# Patient Record
Sex: Female | Born: 1995 | Race: White | Hispanic: No | Marital: Single | Smoking: Never smoker
Health system: Southern US, Community
[De-identification: ages and names within clinical notes are randomized; demographics above are authoritative.]

## PROBLEM LIST (undated history)

## (undated) ENCOUNTER — Emergency Department (HOSPITAL_COMMUNITY): Admission: EM | Payer: Self-pay | Source: Home / Self Care

## (undated) DIAGNOSIS — F32A Depression, unspecified: Secondary | ICD-10-CM

## (undated) DIAGNOSIS — M26609 Unspecified temporomandibular joint disorder, unspecified side: Secondary | ICD-10-CM

## (undated) DIAGNOSIS — F419 Anxiety disorder, unspecified: Secondary | ICD-10-CM

## (undated) DIAGNOSIS — F909 Attention-deficit hyperactivity disorder, unspecified type: Secondary | ICD-10-CM

## (undated) HISTORY — DX: Anxiety disorder, unspecified: F41.9

## (undated) HISTORY — DX: Depression, unspecified: F32.A

## (undated) HISTORY — PX: WISDOM TOOTH EXTRACTION: SHX21

## (undated) HISTORY — DX: Unspecified temporomandibular joint disorder, unspecified side: M26.609

## (undated) HISTORY — DX: Attention-deficit hyperactivity disorder, unspecified type: F90.9

---
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
## 2013-06-16 ENCOUNTER — Ambulatory Visit (INDEPENDENT_AMBULATORY_CARE_PROVIDER_SITE_OTHER): Payer: BC Managed Care – PPO | Admitting: Women's Health

## 2013-06-16 ENCOUNTER — Encounter: Payer: Self-pay | Admitting: Women's Health

## 2013-06-16 VITALS — BP 122/80 | Ht 63.0 in | Wt 131.5 lb

## 2013-06-16 DIAGNOSIS — Z3200 Encounter for pregnancy test, result unknown: Secondary | ICD-10-CM

## 2013-06-16 DIAGNOSIS — Z309 Encounter for contraceptive management, unspecified: Secondary | ICD-10-CM

## 2013-06-16 DIAGNOSIS — Z3049 Encounter for surveillance of other contraceptives: Secondary | ICD-10-CM

## 2013-06-16 DIAGNOSIS — Z3202 Encounter for pregnancy test, result negative: Secondary | ICD-10-CM

## 2013-06-16 MED ORDER — NORETHINDRONE ACET-ETHINYL EST 1-20 MG-MCG PO TABS: 1.0000 | ORAL_TABLET | Freq: Every day | ORAL | Status: DC

## 2013-06-16 NOTE — Progress Notes (Signed)
Christina Macias is a 17 y.o. G0 Caucasian female who is here to initiate contraception. Going to college in few months. LMP 06/02/13.  Does not smoke, no h/o DVT/PE/blood clots, does have menstrual migraines w/o aura Reviewed contraception options- desires generic COC  O: BP 122/80  Ht 5\' 3"  (1.6 m)  Wt 131 lb 8 oz (59.648 kg)  BMI 23.3 kg/m2  LMP 06/02/2013   A:  Contraception initiation   P: Rx Junel w/ 12RF     Discussed need for condoms for STD prevention     F/U 1 month  Marge Duncans, [place] 06/16/2013 12:13 PM

## 2013-06-16 NOTE — Patient Instructions (Signed)
Oral Contraception Use  Oral contraceptives (OCs) are medicines taken to prevent pregnancy. OCs work by preventing the ovaries from releasing eggs. The hormones in OCs also cause the cervical mucus to thicken, preventing the sperm from entering the uterus. The hormones also cause the uterine lining to become thin, not allowing a fertilized egg to attach to the inside of the uterus. OCs are highly effective when taken exactly as prescribed. However, OCs do not prevent sexually transmitted diseases (STDs). Safe sex practices, such as using condoms along with an OC, can help prevent STDs.   Before taking OCs, you may have a physical exam and Pap test. Your caregiver may also order blood tests if necessary. Your caregiver will make sure you are a good candidate for oral contraception. Discuss with your caregiver the possible side effects of the OC you may be prescribed. When starting an OC, it can take 2 to 3 months for the body to adjust to the changes in hormone levels in your body.   HOW TO TAKE ORAL CONTRACEPTIVES  Your caregiver may advise you on how to start taking the first cycle of OCs. Otherwise, you can:  · Start on day 1 of your menstrual period. You will not need any backup contraceptive protection with this start time.  · Start on the first Sunday after your menstrual period or the day you get your prescription. In these cases, you will need to use backup contraceptive protection for the first 7-day cycle.  After you have started taking OCs:  · If you forget to take 1 pill, take it as soon as you remember. Take the next pill at the regular time.  · If you miss 2 or more pills, use backup birth control until your next menstrual period starts.  · If you use a 28-day pack that contains inactive pills and you miss 1 of the last 7 pills (pills with no hormones), it will not matter. Throw away the rest of the non-hormone pills and start a new pill pack.  No matter which day you start the OC, you will always start  a new pack on that same day of the week. Have an extra pack of OCs and a backup contraceptive method available in case you miss some pills or lose your OC pack.  HOME CARE INSTRUCTIONS   · Do not smoke.  · Always use a condom to protect against STDs. OCs do not protect against STDs.  · Use a calendar to mark your menstrual period days.  · Read the information and directions that come with your OC. Talk to your caregiver if you have questions.  SEEK MEDICAL CARE IF:   · You develop nausea and vomiting.  · You have abnormal vaginal discharge or bleeding.  · You develop a rash.  · You miss your menstrual period.  · You are losing your hair.  · You need treatment for mood swings or depression.  · You get dizzy when taking the OC.  · You develop acne from taking the OC.  · You become pregnant.  SEEK IMMEDIATE MEDICAL CARE IF:   · You develop chest pain.  · You develop shortness of breath.  · You have an uncontrolled or severe headache.  · You develop numbness or slurred speech.  · You develop visual problems.  · You develop pain, redness, and swelling in the legs.  Document Released: 11/30/2011 Document Revised: 03/04/2012 Document Reviewed: 11/30/2011  ExitCare® Patient Information ©2014 [employer].

## 2013-07-14 ENCOUNTER — Encounter: Payer: Self-pay | Admitting: Women's Health

## 2013-07-14 ENCOUNTER — Ambulatory Visit (INDEPENDENT_AMBULATORY_CARE_PROVIDER_SITE_OTHER): Payer: BC Managed Care – PPO | Admitting: Women's Health

## 2013-07-14 VITALS — BP 110/50 | Ht 63.0 in | Wt 130.0 lb

## 2013-07-14 DIAGNOSIS — Z309 Encounter for contraceptive management, unspecified: Secondary | ICD-10-CM

## 2013-07-14 DIAGNOSIS — Z3049 Encounter for surveillance of other contraceptives: Secondary | ICD-10-CM

## 2013-07-14 MED ORDER — NORETHINDRONE ACET-ETHINYL EST 1-20 MG-MCG PO TABS: 1.0000 | ORAL_TABLET | Freq: Every day | ORAL | Status: DC

## 2013-07-14 NOTE — Progress Notes (Signed)
Patient ID: Christina Macias, female   DOB: 02-14-1996, 17 y.o.   MRN: [id number] Christina Macias is a 17 y.o.  who is here today for f/u visit after being placed on oral contraception one month ago.  She is taking Junel daily w/o any problems.  Her pack only has 21 pills and she would like a pack that has a placebo week so that she doesn't get out of the habit of taking on that week.  She leaves for college in a few weeks!  O: BP 110/50  Ht 5\' 3"  (1.6 m)  Wt 130 lb (58.968 kg)  BMI 23.03 kg/m2  LMP 07/07/2013   A: Contraception management  P: Rx changed to Junel 1/20 28tab pack, w/ 11 RF     Return 1 year for contra management visit or sooner prn problems     Condoms for STI prevention  Marge Duncans, CNM 07/14/2013 12:11 PM

## 2013-07-14 NOTE — Patient Instructions (Signed)
Oral Contraception Use  Oral contraceptives (OCs) are medicines taken to prevent pregnancy. OCs work by preventing the ovaries from releasing eggs. The hormones in OCs also cause the cervical mucus to thicken, preventing the sperm from entering the uterus. The hormones also cause the uterine lining to become thin, not allowing a fertilized egg to attach to the inside of the uterus. OCs are highly effective when taken exactly as prescribed. However, OCs do not prevent sexually transmitted diseases (STDs). Safe sex practices, such as using condoms along with an OC, can help prevent STDs.   Before taking OCs, you may have a physical exam and Pap test. Your caregiver may also order blood tests if necessary. Your caregiver will make sure you are a good candidate for oral contraception. Discuss with your caregiver the possible side effects of the OC you may be prescribed. When starting an OC, it can take 2 to 3 months for the body to adjust to the changes in hormone levels in your body.   HOW TO TAKE ORAL CONTRACEPTIVES  Your caregiver may advise you on how to start taking the first cycle of OCs. Otherwise, you can:  · Start on day 1 of your menstrual period. You will not need any backup contraceptive protection with this start time.  · Start on the first Sunday after your menstrual period or the day you get your prescription. In these cases, you will need to use backup contraceptive protection for the first 7-day cycle.  After you have started taking OCs:  · If you forget to take 1 pill, take it as soon as you remember. Take the next pill at the regular time.  · If you miss 2 or more pills, use backup birth control until your next menstrual period starts.  · If you use a 28-day pack that contains inactive pills and you miss 1 of the last 7 pills (pills with no hormones), it will not matter. Throw away the rest of the non-hormone pills and start a new pill pack.  No matter which day you start the OC, you will always start  a new pack on that same day of the week. Have an extra pack of OCs and a backup contraceptive method available in case you miss some pills or lose your OC pack.  HOME CARE INSTRUCTIONS   · Do not smoke.  · Always use a condom to protect against STDs. OCs do not protect against STDs.  · Use a calendar to mark your menstrual period days.  · Read the information and directions that come with your OC. Talk to your caregiver if you have questions.  SEEK MEDICAL CARE IF:   · You develop nausea and vomiting.  · You have abnormal vaginal discharge or bleeding.  · You develop a rash.  · You miss your menstrual period.  · You are losing your hair.  · You need treatment for mood swings or depression.  · You get dizzy when taking the OC.  · You develop acne from taking the OC.  · You become pregnant.  SEEK IMMEDIATE MEDICAL CARE IF:   · You develop chest pain.  · You develop shortness of breath.  · You have an uncontrolled or severe headache.  · You develop numbness or slurred speech.  · You develop visual problems.  · You develop pain, redness, and swelling in the legs.  Document Released: 11/30/2011 Document Revised: 03/04/2012 Document Reviewed: 11/30/2011  ExitCare® Patient Information ©2014 [employer].

## 2014-06-22 ENCOUNTER — Telehealth: Payer: Self-pay | Admitting: Obstetrics & Gynecology

## 2014-06-22 MED ORDER — NORETHINDRONE ACET-ETHINYL EST 1-20 MG-MCG PO TABS: 1.0000 | ORAL_TABLET | Freq: Every day | ORAL | Status: DC

## 2014-06-22 NOTE — Telephone Encounter (Signed)
Pt requesting refill on Microgestin.

## 2014-06-22 NOTE — Addendum Note (Signed)
Addended by: Cheral MarkerBOOKER, KIMBERLY R on: 06/22/2014 05:13 PM   Modules accepted: Orders

## 2014-07-14 ENCOUNTER — Encounter: Payer: Self-pay | Admitting: Women's Health

## 2014-07-14 ENCOUNTER — Ambulatory Visit (INDEPENDENT_AMBULATORY_CARE_PROVIDER_SITE_OTHER): Payer: BC Managed Care – PPO | Admitting: Women's Health

## 2014-07-14 VITALS — BP 116/70 | Ht 63.0 in | Wt 138.0 lb

## 2014-07-14 DIAGNOSIS — N898 Other specified noninflammatory disorders of vagina: Secondary | ICD-10-CM

## 2014-07-14 DIAGNOSIS — Z3041 Encounter for surveillance of contraceptive pills: Secondary | ICD-10-CM

## 2014-07-14 DIAGNOSIS — N9489 Other specified conditions associated with female genital organs and menstrual cycle: Secondary | ICD-10-CM

## 2014-07-14 LAB — POCT WET PREP (WET MOUNT): Clue Cells Wet Prep Whiff POC: NEGATIVE

## 2014-07-14 MED ORDER — NORETHINDRONE ACET-ETHINYL EST 1-20 MG-MCG PO TABS: 1.0000 | ORAL_TABLET | Freq: Every day | ORAL | Status: DC

## 2014-07-14 NOTE — Progress Notes (Signed)
Patient ID: Clarice Polelison G Gaxiola, female   DOB: 07/12/1996, 18 y.o.   MRN: [id number]   [facility]  Patient name: Clarice Polelison G Lown MRN [id number]  Date of birth: 05/26/1996  CC & HPI:  Clarice Polelison G Wetherington is a 18 y.o. Caucasian female presenting today for 696yr check-up after being on coc's. Doing well on Junel 1/20. Some vaginal odor x about 1wk, did have wisdom teeth pulled and has been on amoxicillin. Denies itching/irritation, abnormal d/c. Not sexually active.  Pertinent History Reviewed:  Medical & Surgical Hx:   History reviewed. No pertinent past medical history. Past Surgical History  Procedure Laterality Date  . Wisdom tooth extraction     Medications: Reviewed & Updated - see associated section Social History: Reviewed -  reports that she has never smoked. She does not have any smokeless tobacco history on file.  Objective Findings:  Vitals: BP 116/70  Ht 5\' 3"  (1.6 m)  Wt 138 lb (62.596 kg)  BMI 24.45 kg/m2  LMP 06/14/2014  Physical Examination: General appearance - alert, well appearing, and in no distress Pelvic - normal external genitalia, vulva, vagina, cervix, uterus and adnexa Creamy white nonodorous d/c  Results for orders placed in visit on 07/14/14 (from the past 24 hour(s))  POCT WET PREP (WET MOUNT)   Collection Time    07/14/14  9:23 AM      Result Value Ref Range   Source Wet Prep POC vaginal     WBC, Wet Prep HPF POC none     Bacteria Wet Prep HPF POC none     BACTERIA WET PREP MORPHOLOGY POC       Clue Cells Wet Prep HPF POC None     CLUE CELLS WET PREP WHIFF POC Negative Whiff     Yeast Wet Prep HPF POC None     KOH Wet Prep POC       Trichomonas Wet Prep HPF POC none       Assessment & Plan:  A:   Contraception management  Vaginal odor is more likely urine odor from antbx use P:  Refilled Junel 1/20 x 26yr   F/U 736yr for f/u, pap @ 19yo  Marge DuncansBooker, Anabel Lykins Randall CNM, [facility] 07/14/2014 9:23 AM

## 2014-07-14 NOTE — Patient Instructions (Signed)
Oral Contraception Use Oral contraceptive pills (OCPs) are medicines taken to prevent pregnancy. OCPs work by preventing the ovaries from releasing eggs. The hormones in OCPs also cause the cervical mucus to thicken, preventing the sperm from entering the uterus. The hormones also cause the uterine lining to become thin, not allowing a fertilized egg to attach to the inside of the uterus. OCPs are highly effective when taken exactly as prescribed. However, OCPs do not prevent sexually transmitted diseases (STDs). Safe sex practices, such as using condoms along with an OCP, can help prevent STDs. Before taking OCPs, you may have a physical exam and Pap test. Your health care provider may also order blood tests if necessary. Your health care provider will make sure you are a good candidate for oral contraception. Discuss with your health care provider the possible side effects of the OCP you may be prescribed. When starting an OCP, it can take 2 to 3 months for the body to adjust to the changes in hormone levels in your body.  HOW TO TAKE ORAL CONTRACEPTIVE PILLS Your health care provider may advise you on how to start taking the first cycle of OCPs. Otherwise, you can:   Start on day 1 of your menstrual period. You will not need any backup contraceptive protection with this start time.   Start on the first Sunday after your menstrual period or the day you get your prescription. In these cases, you will need to use backup contraceptive protection for the first week.   Start the pill at any time of your cycle. If you take the pill within 5 days of the start of your period, you are protected against pregnancy right away. In this case, you will not need a backup form of birth control. If you start at any other time of your menstrual cycle, you will need to use another form of birth control for 7 days. If your OCP is the type called a minipill, it will protect you from pregnancy after taking it for 2 days (48  hours). After you have started taking OCPs:   If you forget to take 1 pill, take it as soon as you remember. Take the next pill at the regular time.   If you miss 2 or more pills, call your health care provider because different pills have different instructions for missed doses. Use backup birth control until your next menstrual period starts.   If you use a 28-day pack that contains inactive pills and you miss 1 of the last 7 pills (pills with no hormones), it will not matter. Throw away the rest of the nonhormone pills and start a new pill pack.  No matter which day you start the OCP, you will always start a new pack on that same day of the week. Have an extra pack of OCPs and a backup contraceptive method available in case you miss some pills or lose your OCP pack.  HOME CARE INSTRUCTIONS   Do not smoke.   Always use a condom to protect against STDs. OCPs do not protect against STDs.   Use a calendar to mark your menstrual period days.   Read the information and directions that came with your OCP. Talk to your health care provider if you have questions.  SEEK MEDICAL CARE IF:   You develop nausea and vomiting.   You have abnormal vaginal discharge or bleeding.   You develop a rash.   You miss your menstrual period.   You are losing   your hair.   You need treatment for mood swings or depression.   You get dizzy when taking the OCP.   You develop acne from taking the OCP.   You become pregnant.  SEEK IMMEDIATE MEDICAL CARE IF:   You develop chest pain.   You develop shortness of breath.   You have an uncontrolled or severe headache.   You develop numbness or slurred speech.   You develop visual problems.   You develop pain, redness, and swelling in the legs.  Document Released: 11/30/2011 Document Revised: 08/13/2013 Document Reviewed: 06/01/2013 ExitCare Patient Information 2015 [employer]. This information is not intended to replace  advice given to you by your health care provider. Make sure you discuss any questions you have with your health care provider.  

## 2015-06-19 ENCOUNTER — Other Ambulatory Visit: Payer: Self-pay | Admitting: Women's Health

## 2015-07-05 ENCOUNTER — Encounter: Payer: Self-pay | Admitting: Women's Health

## 2015-07-05 ENCOUNTER — Ambulatory Visit (INDEPENDENT_AMBULATORY_CARE_PROVIDER_SITE_OTHER): Payer: BLUE CROSS/BLUE SHIELD | Admitting: Women's Health

## 2015-07-05 VITALS — BP 120/70 | HR 92 | Ht 63.0 in | Wt 148.0 lb

## 2015-07-05 DIAGNOSIS — Z3041 Encounter for surveillance of contraceptive pills: Secondary | ICD-10-CM | POA: Diagnosis not present

## 2015-07-05 MED ORDER — NORETHIN ACE-ETH ESTRAD-FE 1-20 MG-MCG PO TABS: 1.0000 | ORAL_TABLET | Freq: Every day | ORAL | Status: DC

## 2015-07-05 NOTE — Progress Notes (Signed)
Patient ID: Christina Macias, female   DOB: 01/30/1996, 19 y.o.   MRN: [id number]   [facility]  Patient name: Christina Polelison G Michl MRN [id number]  Date of birth: 10/02/1996  CC & HPI:  Christina Polelison G Thain is a 19 y.o. Caucasian female presenting today for 221yr COC check-up. Doing well on microgestin 1/20, no problems. Turns 19yo in Dec, will need pap. Going to school in [place] for [place] education, will be home for Christmas, will try to schedule then.   Pertinent History Reviewed:  Medical & Surgical Hx:   History reviewed. No pertinent past medical history. Past Surgical History  Procedure Laterality Date  . Wisdom tooth extraction     Medications: Reviewed & Updated - see associated section Social History: Reviewed -  reports that she has never smoked. She does not have any smokeless tobacco history on file.  Objective Findings:  Vitals: BP 120/70 mmHg  Pulse 92  Ht 5\' 3"  (1.6 m)  Wt 148 lb (67.132 kg)  BMI 26.22 kg/m2  LMP 06/20/2015  Physical Examination: General appearance - alert, well appearing, and in no distress  No results found for this or any previous visit (from the past 24 hour(s)).   Assessment & Plan:  A:   Contraception management P:  Rx Microgestin 1/20 w/ 11RF  Condoms for STI prevention   F/U in Dec for pap & physical   Marge DuncansBooker, Gwin Eagon Randall CNM, [facility] 07/05/2015 9:49 AM

## 2015-07-05 NOTE — Patient Instructions (Signed)
Ethinyl Estradiol; Norethindrone Acetate tablets (contraception) What is this medicine? ETHINYL ESTRADIOL; NORETHINDRONE ACETATE (ETH in il es tra DYE ole; nor eth IN drone AS e tate) is an oral contraceptive. The products combine two types of female hormones, an estrogen and a progestin. They are used to prevent ovulation and pregnancy. This medicine may be used for other purposes; ask your health care provider or pharmacist if you have questions. COMMON BRAND NAME(S): Gildess, Junel 1.5/30, Junel 1/20, LARIN, Loestrin 1.5/30, Loestrin 1/20, Microgestin 1.5/30, Microgestin 1/20 What should I tell my health care provider before I take this medicine? They need to know if you have or ever had any of these conditions: -abnormal vaginal bleeding -blood vessel disease or blood clots -breast, cervical, endometrial, ovarian, liver, or uterine cancer -diabetes -gallbladder disease -heart disease or recent heart attack -high blood pressure -high cholesterol -kidney disease -liver disease -migraine headaches -stroke -systemic lupus erythematosus (SLE) -tobacco smoker -an unusual or allergic reaction to estrogens, progestins, other medicines, foods, dyes, or preservatives -pregnant or trying to get pregnant -breast-feeding How should I use this medicine? Take this medicine by mouth. To reduce nausea, this medicine may be taken with food. Follow the directions on the prescription label. Take this medicine at the same time each day and in the order directed on the package. Do not take your medicine more often than directed. Contact your pediatrician regarding the use of this medicine in children. Special care may be needed. This medicine has been used in female children who have started having menstrual periods. A patient package insert for the product will be given with each prescription and refill. Read this sheet carefully each time. The sheet may change frequently. Overdosage: If you think you  have taken too much of this medicine contact a poison control center or emergency room at once. NOTE: This medicine is only for you. Do not share this medicine with others. What if I miss a dose? If you miss a dose, refer to the patient information sheet you received with your medicine for direction. If you miss more than one pill, this medicine may not be as effective and you may need to use another form of birth control. What may interact with this medicine? -acetaminophen -antibiotics or medicines for infections, especially rifampin, rifabutin, rifapentine, and griseofulvin, and possibly penicillins or tetracyclines -aprepitant -ascorbic acid (vitamin C) -atorvastatin -barbiturate medicines, such as phenobarbital -bosentan -carbamazepine -caffeine -clofibrate -cyclosporine -dantrolene -doxercalciferol -felbamate -grapefruit juice -hydrocortisone -medicines for anxiety or sleeping problems, such as diazepam or temazepam -medicines for diabetes, including pioglitazone -mineral oil -modafinil -mycophenolate -nefazodone -oxcarbazepine -phenytoin -prednisolone -ritonavir or other medicines for HIV infection or AIDS -rosuvastatin -selegiline -soy isoflavones supplements -St. John's wort -tamoxifen or raloxifene -theophylline -thyroid hormones -topiramate -warfarin This list may not describe all possible interactions. Give your health care provider a list of all the medicines, herbs, non-prescription drugs, or dietary supplements you use. Also tell them if you smoke, drink alcohol, or use illegal drugs. Some items may interact with your medicine. What should I watch for while using this medicine? Visit your doctor or health care professional for regular checks on your progress. You will need a regular breast and pelvic exam and Pap smear while on this medicine. Use an additional method of contraception during the first cycle that you take these tablets. If you have any reason  to think you are pregnant, stop taking this medicine right away and contact your doctor or health care professional. If you are taking this   medicine for hormone related problems, it may take several cycles of use to see improvement in your condition. Smoking increases the risk of getting a blood clot or having a stroke while you are taking birth control pills, especially if you are more than 21 years old. You are strongly advised not to smoke. This medicine can make your body retain fluid, making your fingers, hands, or ankles swell. Your blood pressure can go up. Contact your doctor or health care professional if you feel you are retaining fluid. This medicine can make you more sensitive to the sun. Keep out of the sun. If you cannot avoid being in the sun, wear protective clothing and use sunscreen. Do not use sun lamps or tanning beds/booths. If you wear contact lenses and notice visual changes, or if the lenses begin to feel uncomfortable, consult your eye care specialist. In some women, tenderness, swelling, or minor bleeding of the gums may occur. Notify your dentist if this happens. Brushing and flossing your teeth regularly may help limit this. See your dentist regularly and inform your dentist of the medicines you are taking. If you are going to have elective surgery, you may need to stop taking this medicine before the surgery. Consult your health care professional for advice. This medicine does not protect you against HIV infection (AIDS) or any other sexually transmitted diseases. What side effects may I notice from receiving this medicine? Side effects that you should report to your doctor or health care professional as soon as possible: -breast tissue changes or discharge -changes in vaginal bleeding during your period or between your periods -chest pain -coughing up blood -dizziness or fainting spells -headaches or migraines -leg, arm or groin pain -severe or sudden  headaches -stomach pain (severe) -sudden shortness of breath -sudden loss of coordination, especially on one side of the body -speech problems -symptoms of vaginal infection like itching, irritation or unusual discharge -tenderness in the upper abdomen -vomiting -weakness or numbness in the arms or legs, especially on one side of the body -yellowing of the eyes or skin Side effects that usually do not require medical attention (report to your doctor or health care professional if they continue or are bothersome): -breakthrough bleeding and spotting that continues beyond the 3 initial cycles of pills -breast tenderness -mood changes, anxiety, depression, frustration, anger, or emotional outbursts -increased sensitivity to sun or ultraviolet light -nausea -skin rash, acne, or brown spots on the skin -weight gain (slight) This list may not describe all possible side effects. Call your doctor for medical advice about side effects. You may report side effects to FDA at 1-800-FDA-1088. Where should I keep my medicine? Keep out of the reach of children. Store at room temperature between 15 and 30 degrees C (59 and 86 degrees F). Throw away any unused medicine after the expiration date. NOTE: This sheet is a summary. It may not cover all possible information. If you have questions about this medicine, talk to your doctor, pharmacist, or health care provider.  2015, Elsevier/Gold Standard. (2013-04-18 15:35:20)  

## 2016-03-13 ENCOUNTER — Telehealth: Payer: Self-pay | Admitting: *Deleted

## 2016-03-13 MED ORDER — NORETHIN ACE-ETH ESTRAD-FE 1-20 MG-MCG PO TABS: 1.0000 | ORAL_TABLET | Freq: Every day | ORAL | Status: DC

## 2016-03-13 NOTE — Telephone Encounter (Signed)
Pt states insurance is requiring her to [facility] for [facility] day supply.    Pt requesting refill for Microgestin FE 90 day supply, [facility] phone # [phone].

## 2016-03-13 NOTE — Telephone Encounter (Signed)
Ok to refill per pt request

## 2016-06-05 ENCOUNTER — Other Ambulatory Visit: Payer: BLUE CROSS/BLUE SHIELD | Admitting: Women's Health

## 2016-06-07 ENCOUNTER — Encounter: Payer: Self-pay | Admitting: Advanced Practice Midwife

## 2016-06-07 ENCOUNTER — Other Ambulatory Visit (HOSPITAL_COMMUNITY)
Admission: RE | Admit: 2016-06-07 | Discharge: 2016-06-07 | Disposition: A | Discharge status: Home / Self Care | Payer: BLUE CROSS/BLUE SHIELD | Source: Ambulatory Visit | Attending: Advanced Practice Midwife | Admitting: Advanced Practice Midwife

## 2016-06-07 ENCOUNTER — Ambulatory Visit (INDEPENDENT_AMBULATORY_CARE_PROVIDER_SITE_OTHER): Payer: BLUE CROSS/BLUE SHIELD | Admitting: Advanced Practice Midwife

## 2016-06-07 VITALS — BP 100/80 | HR 78 | Ht 62.0 in | Wt 160.0 lb

## 2016-06-07 DIAGNOSIS — Z01419 Encounter for gynecological examination (general) (routine) without abnormal findings: Secondary | ICD-10-CM | POA: Diagnosis not present

## 2016-06-07 DIAGNOSIS — Z113 Encounter for screening for infections with a predominantly sexual mode of transmission: Secondary | ICD-10-CM | POA: Diagnosis present

## 2016-06-07 DIAGNOSIS — Z1151 Encounter for screening for human papillomavirus (HPV): Secondary | ICD-10-CM | POA: Diagnosis not present

## 2016-06-07 MED ORDER — NORETHIN ACE-ETH ESTRAD-FE 1-20 MG-MCG PO TABS: 1.0000 | ORAL_TABLET | Freq: Every day | ORAL | Status: DC

## 2016-06-07 NOTE — Progress Notes (Signed)
Christina Macias 19 y.o.  Filed Vitals:   06/07/16 1149  BP: 100/80  Pulse: 78     Filed Weights   06/07/16 1149  Weight: 160 lb (72.576 kg)    Past Medical History: History reviewed. No pertinent past medical history.  Past Surgical History: Past Surgical History  Procedure Laterality Date  . Wisdom tooth extraction      Family History: Family History  Problem Relation Age of Onset  . Hypertension Father   . Diabetes Paternal Grandmother   . Cancer Other     breast    Social History: Social History  Substance Use Topics  . Smoking status: Never Smoker   . Smokeless tobacco: None  . Alcohol Use: No    Allergies: No Known Allergies    Current outpatient prescriptions:  .  norethindrone-ethinyl estradiol (MICROGESTIN FE 1/20) 1-20 MG-MCG tablet, Take 1 tablet by mouth daily., Disp: 84 tablet, Rfl: 3  History of Present Illness: Here for first pap smear.  Has been on COCs for a while and wants to continue.  No problems.  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 Has had Gardisil series  Review of Systems   Patient denies any headaches, blurred vision, shortness of breath, chest pain, abdominal pain, problems with bowel movements, urination, or intercourse.   Physical Exam: General:  Well developed, well nourished, no acute distress Skin:  Warm and dry Neck:  Midline trachea, normal thyroid Lungs; Clear to auscultation bilaterally Breast:  No dominant palpable mass, retraction, or nipple discharge Cardiovascular: Regular rate and rhythm Abdomen:  Soft, non tender, no hepatosplenomegaly Pelvic:  External genitalia is normal in appearance.  The vagina is normal in appearance.  The cervix is nulliparous Uterus is felt to be normal size, shape, and contour.  No adnexal masses or tenderness noted.  Extremities:  No swelling or varicosities noted Psych:  No mood changes.     Impression: Normal gyn exam     Plan: if normal, pap q 3 years  Meds  ordered this encounter  Medications  . norethindrone-ethinyl estradiol (MICROGESTIN FE 1/20) 1-20 MG-MCG tablet    Sig: Take 1 tablet by mouth daily.    Dispense:  84 tablet    Refill:  3    Order Specific Question:  Supervising Provider    Answer:  Duane LopeEURE, LUTHER H [[id number]]

## 2016-06-08 LAB — CYTOLOGY - PAP

## 2017-07-11 ENCOUNTER — Telehealth: Payer: Self-pay | Admitting: Women's Health

## 2017-07-11 ENCOUNTER — Other Ambulatory Visit: Payer: Self-pay | Admitting: *Deleted

## 2017-07-11 MED ORDER — NORETHIN ACE-ETH ESTRAD-FE 1-20 MG-MCG PO TABS: 1.0000 | ORAL_TABLET | Freq: Every day | ORAL | 3 refills | Status: DC

## 2017-07-11 NOTE — Telephone Encounter (Signed)
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 Pt would like to know if she could get a refill on her Birth control. Last itme pt was seen was 06/07/2016. Please contact pt

## 2017-07-23 ENCOUNTER — Telehealth: Payer: Self-pay | Admitting: Advanced Practice Midwife

## 2017-07-23 NOTE — Telephone Encounter (Signed)
In[facility] Verbalized understanding.

## 2017-07-23 NOTE — Telephone Encounter (Signed)
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 Pt states that she would like a refill of her medication without an appointment. Please contact pt

## 2018-04-18 ENCOUNTER — Other Ambulatory Visit: Payer: Self-pay | Admitting: Advanced Practice Midwife

## 2018-09-24 ENCOUNTER — Other Ambulatory Visit: Payer: Self-pay | Admitting: Advanced Practice Midwife

## 2019-03-10 ENCOUNTER — Other Ambulatory Visit: Payer: Self-pay | Admitting: Advanced Practice Midwife

## 2019-03-21 ENCOUNTER — Telehealth: Payer: Self-pay | Admitting: Advanced Practice Midwife

## 2019-03-21 MED ORDER — NORETHIN ACE-ETH ESTRAD-FE 1-20 MG-MCG PO TABS: 1.0000 | ORAL_TABLET | Freq: Every day | ORAL | 0 refills | Status: DC

## 2019-03-21 NOTE — Telephone Encounter (Signed)
Refilled Junel

## 2019-03-21 NOTE — Addendum Note (Signed)
Addended by: Cyril Mourning A on: 03/21/2019 02:17 PM   Modules accepted: Orders

## 2019-03-21 NOTE — Telephone Encounter (Signed)
Pt has been waiting for refill on birth control Pharmacy said they had sent x2 with not response

## 2019-06-02 ENCOUNTER — Telehealth: Payer: Self-pay | Admitting: Adult Health

## 2019-06-02 NOTE — Telephone Encounter (Signed)
Pt has 2 weeks left of her birth control pills and wanting to see if she needs an appt to get a refill or if a refill can be sent in.

## 2019-06-09 ENCOUNTER — Other Ambulatory Visit: Payer: Self-pay | Admitting: Adult Health

## 2019-06-20 ENCOUNTER — Other Ambulatory Visit: Payer: BLUE CROSS/BLUE SHIELD | Admitting: Women's Health

## 2019-07-03 ENCOUNTER — Ambulatory Visit (INDEPENDENT_AMBULATORY_CARE_PROVIDER_SITE_OTHER): Payer: BC Managed Care – PPO | Admitting: Adult Health

## 2019-07-03 ENCOUNTER — Encounter: Payer: Self-pay | Admitting: Adult Health

## 2019-07-03 ENCOUNTER — Other Ambulatory Visit (HOSPITAL_COMMUNITY)
Admission: RE | Admit: 2019-07-03 | Discharge: 2019-07-03 | Disposition: A | Discharge status: Home / Self Care | Payer: BC Managed Care – PPO | Source: Ambulatory Visit | Attending: Adult Health | Admitting: Adult Health

## 2019-07-03 ENCOUNTER — Other Ambulatory Visit: Payer: Self-pay

## 2019-07-03 VITALS — BP 120/78 | HR 104 | Ht 63.0 in | Wt 196.4 lb

## 2019-07-03 DIAGNOSIS — Z3041 Encounter for surveillance of contraceptive pills: Secondary | ICD-10-CM | POA: Insufficient documentation

## 2019-07-03 DIAGNOSIS — Z01419 Encounter for gynecological examination (general) (routine) without abnormal findings: Secondary | ICD-10-CM | POA: Insufficient documentation

## 2019-07-03 MED ORDER — NORETHIN ACE-ETH ESTRAD-FE 1-20 MG-MCG PO TABS: 1.0000 | ORAL_TABLET | Freq: Every day | ORAL | 4 refills | Status: DC

## 2019-07-03 NOTE — Progress Notes (Signed)
Patient ID: Christina Macias, female   DOB: 03-Nov-1996, 23 y.o.   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  PCP is Delman Cheadle PA.  Current Medications, Allergies, Past Medical History, Past Surgical History, Family History and Social History were reviewed in [employer] record.     Review of Systems: Patient denies any headaches, hearing loss, fatigue, blurred vision, shortness of breath, chest pain, abdominal pain, problems with bowel movements, urination, or intercourse(no sex in 6 years). No joint pain or mood swings.    Physical Exam:BP 120/78 (BP Location: Right Arm, Patient Position: Sitting, Cuff Size: Normal)   Pulse (!) 104   Ht 5\' 3"  (1.6 m)   Wt 196 lb 6.4 oz (89.1 kg)   LMP 06/10/2019   BMI 34.79 kg/m  General:  Well developed, well nourished, no acute distress Skin:  Warm and dry Neck:  Midline trachea, normal thyroid, good Macias, no lymphadenopathy Lungs; Clear to auscultation bilaterally Breast:  No dominant palpable mass, retraction, or nipple discharge Cardiovascular: Regular rate and rhythm Abdomen:  Soft, non tender, no hepatosplenomegaly Pelvic:  External genitalia is normal in appearance, no lesions.  The vagina is normal in appearance. Urethra has no lesions or masses. The cervix is nulliparous. Pap with reflex HPV performed.  Uterus is felt to be normal size, shape, and contour.  No adnexal masses or tenderness noted.Bladder is non tender, no masses felt. Extremities/musculoskeletal:  No swelling or varicosities noted, no clubbing or cyanosis Psych:  No mood changes, alert and cooperative,seems happy PHQ 2 score 0 Fall risk is low Examination chaperoned by Christina Macias CMA.  Impression: 1. Encounter for gynecological examination with Papanicolaou smear of cervix   2. Encounter for surveillance of  contraceptive pills       Plan: Pap with reflex HPV sent Continue OCs, Meds ordered this encounter  Medications  . norethindrone-ethinyl estradiol (JUNEL FE 1/20) 1-20 MG-MCG tablet    Sig: Take 1 tablet by mouth daily.    Dispense:  84 tablet    Refill:  4    Order Specific Question:   Supervising Provider    Answer:   Christina Macias [[id number]]  Physical in 1 year Pap in 3 if normal Labs with PCP

## 2019-07-03 NOTE — Addendum Note (Signed)
Addended by: Diona Fanti A on: 07/03/2019 11:51 AM   Modules accepted: Orders

## 2019-07-04 LAB — CYTOLOGY - PAP
Adequacy: ABSENT
Diagnosis: NEGATIVE

## 2020-02-25 ENCOUNTER — Encounter: Payer: Self-pay | Admitting: Advanced Practice Midwife

## 2020-02-25 ENCOUNTER — Other Ambulatory Visit (INDEPENDENT_AMBULATORY_CARE_PROVIDER_SITE_OTHER): Payer: BC Managed Care – PPO

## 2020-02-25 ENCOUNTER — Ambulatory Visit: Payer: BC Managed Care – PPO | Admitting: Advanced Practice Midwife

## 2020-02-25 ENCOUNTER — Other Ambulatory Visit: Payer: Self-pay

## 2020-02-25 ENCOUNTER — Encounter: Payer: Self-pay | Admitting: *Deleted

## 2020-02-25 ENCOUNTER — Other Ambulatory Visit: Payer: Self-pay | Admitting: Advanced Practice Midwife

## 2020-02-25 VITALS — BP 128/79 | HR 118 | Ht 62.0 in | Wt 210.0 lb

## 2020-02-25 DIAGNOSIS — R102 Pelvic and perineal pain: Secondary | ICD-10-CM | POA: Diagnosis not present

## 2020-02-25 DIAGNOSIS — R1031 Right lower quadrant pain: Secondary | ICD-10-CM | POA: Diagnosis not present

## 2020-02-25 LAB — POCT URINALYSIS DIPSTICK OB
Blood, UA: NEGATIVE
Glucose, UA: NEGATIVE
Ketones, UA: NEGATIVE
Nitrite, UA: NEGATIVE
POC,PROTEIN,UA: NEGATIVE

## 2020-02-25 NOTE — Progress Notes (Signed)
PELVIC US TA/TV: homogeneous anteverted uterus,wnl,EEC 6.6 mm,normal ovaries,ovaries appear mobile,no free fluid,some left adnexal discomfort during ultrasound

## 2020-02-25 NOTE — Progress Notes (Signed)
   GYN VISIT Patient name: Christina Macias MRN [id number]  Date of birth: 04-11-1996 Chief Complaint:   pelvic pressure  History of Present Illness:   Christina Macias is a 24 y.o. G0P0000 Caucasian female being seen today for onset of RLQ pain on 02/22/20 which she describes as dull, sometimes with sneezing/coughing and sometimes spontaneous. Feels like it is increasing in frequency- now approx 5x/day, with the longest episode lasting and the shortest a couple of mins. No fever, no dysuria. No vag bleeding or discharge; declines STD testing. Not sexually active.     Patient's last menstrual period was 02/15/2020. The current method of family planning is abstinence.  Last pap July 2020. Results were:  normal Review of Systems:   Pertinent items are noted in HPI Denies fever/chills, dizziness, headaches, visual disturbances, fatigue, shortness of breath, chest pain, abdominal pain, vomiting, abnormal vaginal discharge/itching/odor/irritation, problems with periods, bowel movements, urination, or intercourse unless otherwise stated above.  Pertinent History Reviewed:  Reviewed past medical,surgical, social, obstetrical and family history.  Reviewed problem list, medications and allergies. Physical Assessment:   Vitals:   02/25/20 1437  BP: 128/79  Pulse: (!) 118  Weight: 210 lb (95.3 kg)  Height: 5\' 2"  (1.575 m)  Body mass index is 38.41 kg/m.       Physical Examination:   General appearance: alert, well appearing, and in no distress  Mental status: alert, oriented to person, place, and time  Skin: warm & dry   Cardiovascular: normal heart rate noted  Respiratory: normal respiratory effort, no distress  Abdomen: soft, non-tender; no rebound, no guarding  Pelvic: normal external genitalia, vulva, vagina, cervix, uterus and adnexa; unable to replicate pain with bimanual exam  Extremities: no edema    Results for orders placed or performed in visit on 02/25/20 (from the  past 24 hour(s))  POC Urinalysis Dipstick OB   Collection Time: 02/25/20  4:09 PM  Result Value Ref Range   Color, UA     Clarity, UA     Glucose, UA Negative Negative   Bilirubin, UA     Ketones, UA neg    Spec Grav, UA     Blood, UA neg    pH, UA     POC,PROTEIN,UA Negative Negative, Trace, Small (1+), Moderate (2+), Large (3+), 4+   Urobilinogen, UA     Nitrite, UA neg    Leukocytes, UA Small (1+) (A) Negative   Appearance     Odor      Assessment & Plan:  1) Right low abd/pelvic pain> nl pelvic ultrasound; rec f/u with PCP for further eval of discomfort   Meds: No orders of the defined types were placed in this encounter.   Orders Placed This Encounter  Procedures  . POC Urinalysis Dipstick OB    Return for Physical in JulyAugust.  Victorino Dike CNM 02/25/2020 4:28 PM

## 2020-02-29 ENCOUNTER — Ambulatory Visit: Payer: BC Managed Care – PPO | Attending: Internal Medicine

## 2020-02-29 DIAGNOSIS — Z23 Encounter for immunization: Secondary | ICD-10-CM | POA: Insufficient documentation

## 2020-02-29 NOTE — Progress Notes (Signed)
   Covid-19 Vaccination Clinic  Name:  Christina Macias    MRN: [id number] DOB: 1996-11-22  02/29/2020  [name] was observed post Covid-19 immunization for 15 minutes without incident. She was provided with Vaccine Information Sheet and instruction to access the V-Safe system.   [name] was instructed to call 911 with any severe reactions post vaccine: Marland Kitchen Difficulty breathing  . Swelling of face and throat  . A fast heartbeat  . A bad rash all over body  . Dizziness and weakness   Immunizations Administered    Name Date Dose VIS Date Route   Pfizer COVID-19 Vaccine 02/29/2020  8:36 AM 0.3 mL 12/05/2019 Intramuscular   Manufacturer: [employer], [employer]   Lot: [id number]   NDC: [address]-4

## 2020-03-21 ENCOUNTER — Ambulatory Visit: Payer: BC Managed Care – PPO | Attending: Internal Medicine

## 2020-03-21 DIAGNOSIS — Z23 Encounter for immunization: Secondary | ICD-10-CM

## 2020-03-21 NOTE — Progress Notes (Signed)
   Covid-19 Vaccination Clinic  Name:  Christina Macias    MRN: [id number] DOB: 1996/07/05  03/21/2020  [name] was observed post Covid-19 immunization for 15 minutes without incident. She was provided with Vaccine Information Sheet and instruction to access the V-Safe system.   [name] was instructed to call 911 with any severe reactions post vaccine: Marland Kitchen Difficulty breathing  . Swelling of face and throat  . A fast heartbeat  . A bad rash all over body  . Dizziness and weakness   Immunizations Administered    Name Date Dose VIS Date Route   Pfizer COVID-19 Vaccine 03/21/2020  8:58 AM 0.3 mL 12/05/2019 Intramuscular   Manufacturer: [employer], [employer]   Lot: [id number]   NDC: [address]-7

## 2020-08-06 ENCOUNTER — Other Ambulatory Visit: Payer: Self-pay | Admitting: Adult Health

## 2020-08-23 ENCOUNTER — Other Ambulatory Visit: Payer: Self-pay | Admitting: Gastroenterology

## 2020-08-23 DIAGNOSIS — R109 Unspecified abdominal pain: Secondary | ICD-10-CM

## 2020-08-27 ENCOUNTER — Ambulatory Visit
Admission: RE | Admit: 2020-08-27 | Discharge: 2020-08-27 | Disposition: A | Discharge status: Home / Self Care | Payer: BC Managed Care – PPO | Source: Ambulatory Visit | Attending: Gastroenterology | Admitting: Gastroenterology

## 2020-08-27 DIAGNOSIS — R109 Unspecified abdominal pain: Secondary | ICD-10-CM

## 2020-10-25 ENCOUNTER — Other Ambulatory Visit: Payer: Self-pay | Admitting: Adult Health

## 2020-11-16 ENCOUNTER — Other Ambulatory Visit: Payer: Self-pay | Admitting: Gastroenterology

## 2020-11-16 DIAGNOSIS — R109 Unspecified abdominal pain: Secondary | ICD-10-CM

## 2020-11-16 DIAGNOSIS — R197 Diarrhea, unspecified: Secondary | ICD-10-CM

## 2020-11-22 ENCOUNTER — Other Ambulatory Visit: Payer: Self-pay

## 2020-11-22 ENCOUNTER — Ambulatory Visit
Admission: RE | Admit: 2020-11-22 | Discharge: 2020-11-22 | Disposition: A | Discharge status: Home / Self Care | Payer: BC Managed Care – PPO | Source: Ambulatory Visit | Attending: Gastroenterology | Admitting: Gastroenterology

## 2020-11-22 DIAGNOSIS — R197 Diarrhea, unspecified: Secondary | ICD-10-CM

## 2020-11-22 DIAGNOSIS — R109 Unspecified abdominal pain: Secondary | ICD-10-CM

## 2020-11-22 MED ORDER — IOPAMIDOL (ISOVUE-300) INJECTION 61%
100.0000 mL | Freq: Once | INTRAVENOUS | Status: AC | PRN
  Administered 2020-11-22: 100 mL via INTRAVENOUS

## 2020-11-22 MED ORDER — IOPAMIDOL (ISOVUE-300) INJECTION 61%: 100.0000 mL | Freq: Once | INTRAVENOUS | Status: DC | PRN

## 2021-01-04 ENCOUNTER — Telehealth: Payer: Self-pay | Admitting: Adult Health

## 2021-01-04 NOTE — Telephone Encounter (Signed)
Patient called and wanted a nurse to know that shes experiencing cramping and bleeding before it's time, per patient. Clinical staff will follow up with patient.

## 2021-01-04 NOTE — Telephone Encounter (Signed)
Returned pt's call. Pt stated that she has been on this [facility] for almost 10 years with only light bleeding on the placebo week. This past month, she began bleeding and cramping 2 weeks early and was concerned that she needed to be seen. She does not think that the manufacturer has changed. There is no possibility of pregnancy. Her stress level hasn't changed and she is not on any new medications or antibiotics. Pt was instructed to keep a record of her cycle and call the office if there is any severe pain or bleeding.

## 2021-01-09 ENCOUNTER — Other Ambulatory Visit: Payer: Self-pay | Admitting: Women's Health

## 2021-04-02 ENCOUNTER — Other Ambulatory Visit: Payer: Self-pay | Admitting: Women's Health

## 2021-04-04 ENCOUNTER — Other Ambulatory Visit: Payer: Self-pay

## 2021-04-04 ENCOUNTER — Ambulatory Visit: Payer: BC Managed Care – PPO | Admitting: Adult Health

## 2021-04-04 ENCOUNTER — Encounter: Payer: Self-pay | Admitting: Adult Health

## 2021-04-04 VITALS — BP 115/75 | HR 86 | Ht 62.5 in | Wt 210.5 lb

## 2021-04-04 DIAGNOSIS — N926 Irregular menstruation, unspecified: Secondary | ICD-10-CM | POA: Insufficient documentation

## 2021-04-04 DIAGNOSIS — Z3041 Encounter for surveillance of contraceptive pills: Secondary | ICD-10-CM

## 2021-04-04 DIAGNOSIS — Z3202 Encounter for pregnancy test, result negative: Secondary | ICD-10-CM | POA: Diagnosis not present

## 2021-04-04 LAB — POCT URINE PREGNANCY: Preg Test, Ur: NEGATIVE

## 2021-04-04 MED ORDER — NORGESTIMATE-ETH ESTRADIOL 0.25-35 MG-MCG PO TABS: 1.0000 | ORAL_TABLET | Freq: Every day | ORAL | 11 refills | Status: DC

## 2021-04-04 NOTE — Progress Notes (Addendum)
  Subjective:     Patient ID: Christina Macias, female   DOB: [birth year], 25 y.o.   MRN: [id number]  HPI Christina Macias is a  25 year old white female,single, G0P0, in to talk about changing OCs, since having COVID in January periods, heavier and more irregular, she is on junel 1-20 for about 10 years now, she says. She a Runner, broadcasting/film/video. Last pap 07/03/2019 negative. PCP is Terie Purser PA.   Review of Systems Since having Covid in January periods heavier and more irregular, on Junel 1/20, has been on those about 10 years Not currently sexually active  Reviewed past medical,surgical, social and family history. Reviewed medications and allergies.     Objective:   Physical Exam BP 115/75 (BP Location: Left Arm, Patient Position: Sitting, Cuff Size: Large)   Pulse 86   Ht 5' 2.5" (1.588 m)   Wt 210 lb 8 oz (95.5 kg)   LMP 03/28/2021 (Approximate)   BMI 37.89 kg/m UPT is negative. Skin warm and dry.Lungs: clear to ausculation bilaterally. Cardiovascular: regular rate and rhythm.   Fall risk is low  Upstream - 04/04/21 1042      Pregnancy Intention Screening   Does the patient want to become pregnant in the next year? No    Does the patient's partner want to become pregnant in the next year? No    Would the patient like to discuss contraceptive options today? Yes      Contraception Wrap Up   Current Method Abstinence   OC   End Method Oral Contraceptive;Abstinence    Contraception Counseling Provided Yes          Assessment:     1. Pregnancy examination or test, negative result   2. Encounter for surveillance of contraceptive pills Finish current pack of junel and then start sprintec,if has sex use condoms  Meds ordered this encounter  Medications  . norgestimate-ethinyl estradiol (ORTHO-CYCLEN) 0.25-35 MG-MCG tablet    Sig: Take 1 tablet by mouth daily.    Dispense:  28 tablet    Refill:  11    Order Specific Question:   Supervising Provider    Answer:   Despina Hidden, LUTHER H [[id number]]    3. Irregular periods Will try different COC    Plan:     Follow up in 3 months

## 2021-07-04 ENCOUNTER — Encounter: Payer: Self-pay | Admitting: Adult Health

## 2021-07-04 ENCOUNTER — Other Ambulatory Visit: Payer: Self-pay

## 2021-07-04 ENCOUNTER — Ambulatory Visit: Payer: BC Managed Care – PPO | Admitting: Adult Health

## 2021-07-04 VITALS — BP 119/69 | HR 93 | Ht 62.5 in | Wt 211.4 lb

## 2021-07-04 DIAGNOSIS — Z3041 Encounter for surveillance of contraceptive pills: Secondary | ICD-10-CM | POA: Diagnosis not present

## 2021-07-04 MED ORDER — NORGESTIMATE-ETH ESTRADIOL 0.25-35 MG-MCG PO TABS: 1.0000 | ORAL_TABLET | Freq: Every day | ORAL | 11 refills | Status: DC

## 2021-07-04 NOTE — Progress Notes (Signed)
  Subjective:     Patient ID: Christina Macias, female   DOB: 03/21/1996, 25 y.o.   MRN: [id number]  HPI Christina Macias is a 25 year old white female,single, G0P0, back in follow up on periods after starting ortho cyclen in April and periods regular still a little heavier than in the past. She said she wok up Saturday morning with tingling in lips, felt numb and then felt weak right arm and leg and it happened about 4 x Saturday, none since, no vision loss or headache, some pressure behind her eye, had not taken any new medication or eaten new food. PCP is Terie Purser PA.  Review of Systems Periods regular Still a little heavier than in the past Not sexually active Had tingling in lips Saturday  Reviewed past medical,surgical, social and family history. Reviewed medications and allergies.     Objective:   Physical Exam BP 119/69 (BP Location: Right Arm, Patient Position: Sitting, Cuff Size: Normal)   Pulse 93   Ht 5' 2.5" (1.588 m)   Wt 211 lb 6.4 oz (95.9 kg)   LMP 06/29/2021 (Exact Date)   BMI 38.05 kg/m     Skin warm and dry. Lungs: clear to ausculation bilaterally. Cardiovascular: regular rate and rhythm.   Upstream - 07/04/21 1004       Pregnancy Intention Screening   Does the patient want to become pregnant in the next year? No    Does the patient's partner want to become pregnant in the next year? No    Would the patient like to discuss contraceptive options today? No      Contraception Wrap Up   Current Method Oral Contraceptive   no sex   End Method Oral Contraceptive;Abstinence    Contraception Counseling Provided No             Assessment:     1. Encounter for surveillance of contraceptive pills Continue ortho  cyclen Meds ordered this encounter  Medications   norgestimate-ethinyl estradiol (ORTHO-CYCLEN) 0.25-35 MG-MCG tablet    Sig: Take 1 tablet by mouth daily.    Dispense:  28 tablet    Refill:  11    Order Specific Question:   Supervising Provider     Answer:   Lazaro Arms [[id number]]       Plan:    If has any more tingling or weakness go to ER during the episode to be evaluated and she agrees   Pap and physical in 1 year

## 2021-09-14 IMAGING — US US ABDOMEN COMPLETE
1 series · 14 of 25 positions shown · non-contrast
Comparison: None.

CLINICAL DATA: Abdomen discomfort

EXAM:
ABDOMEN ULTRASOUND COMPLETE

[Series 1: us abdomen complete · 0.15mm/px · 14 of 77 slices shown]
[im 1/77]
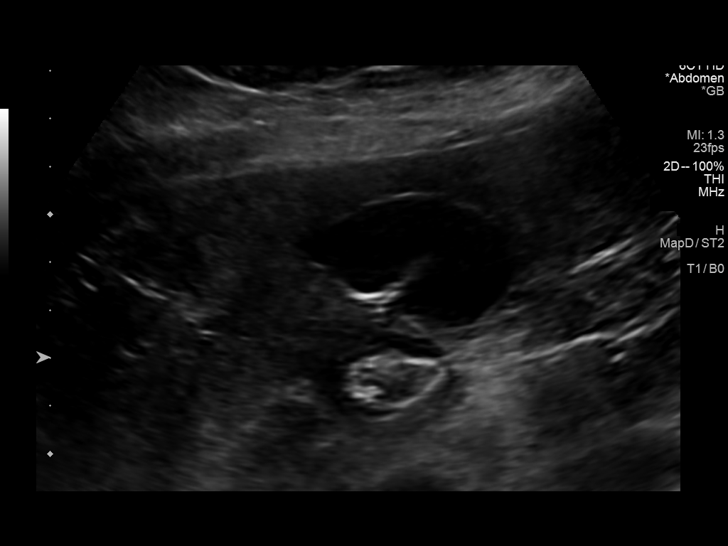
[im 7/77]
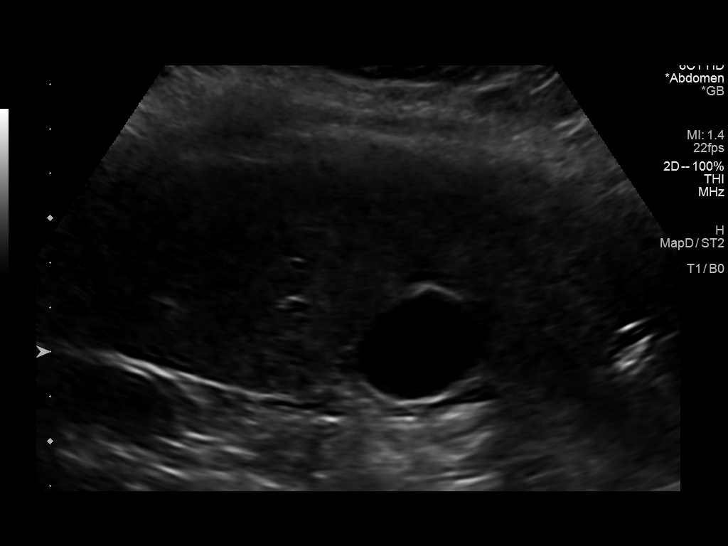
[im 13/77]
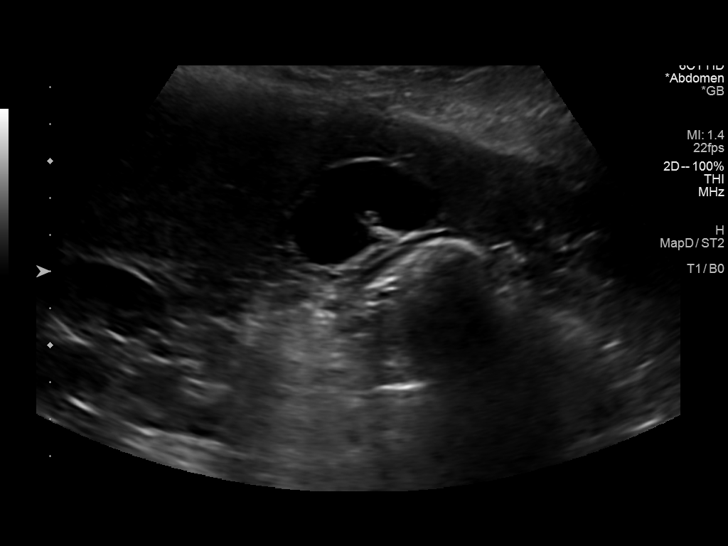
[im 20/77]
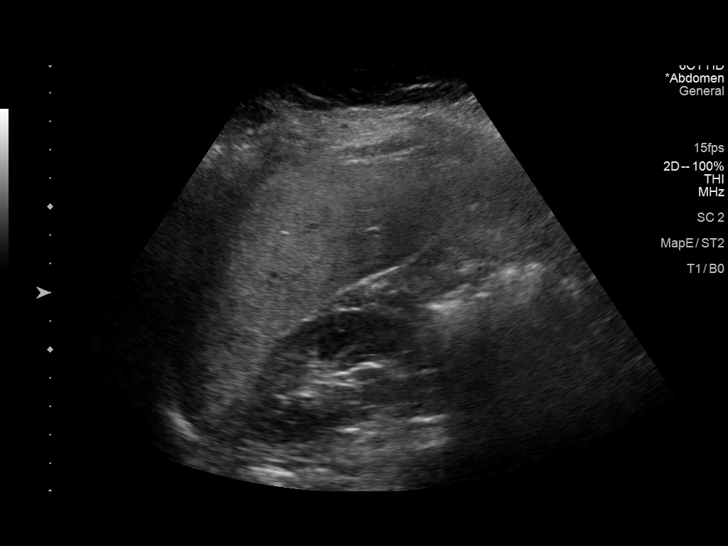
[im 26/77]
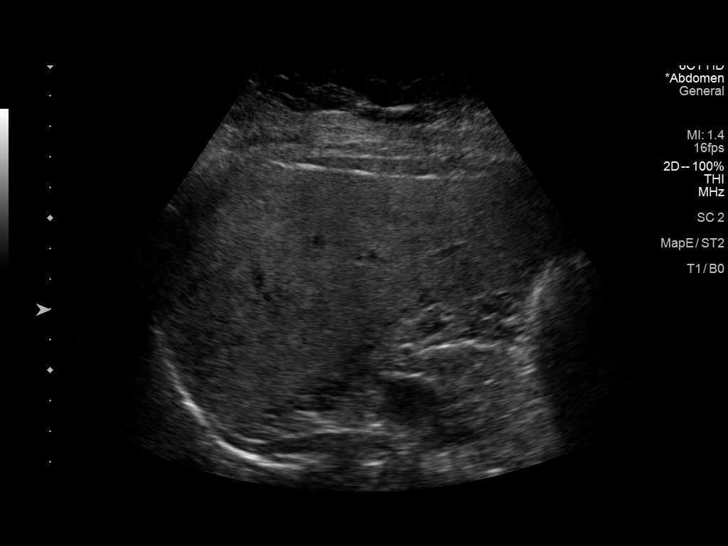
[im 29/77]
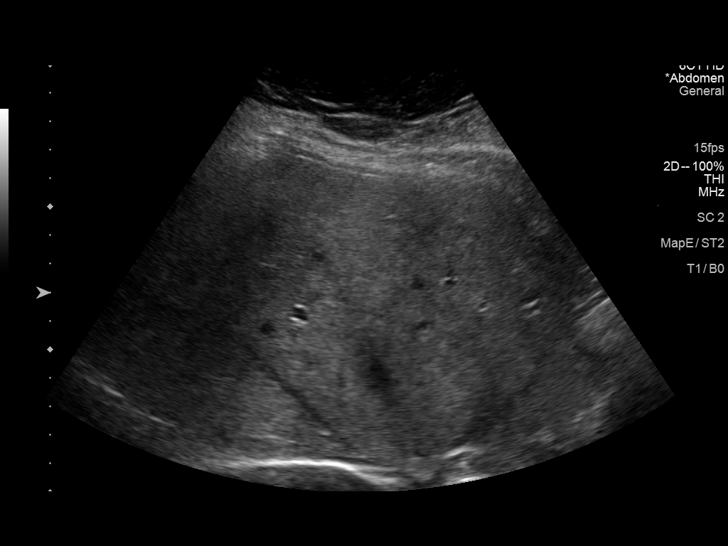
[im 35/77]
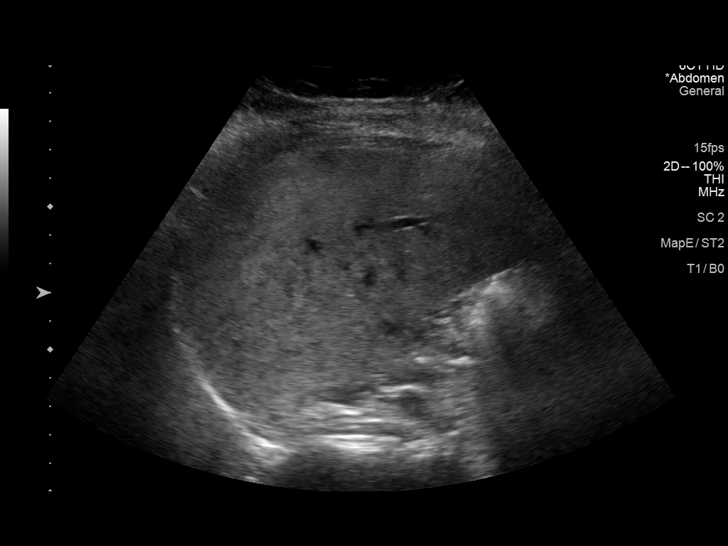
[im 42/77]
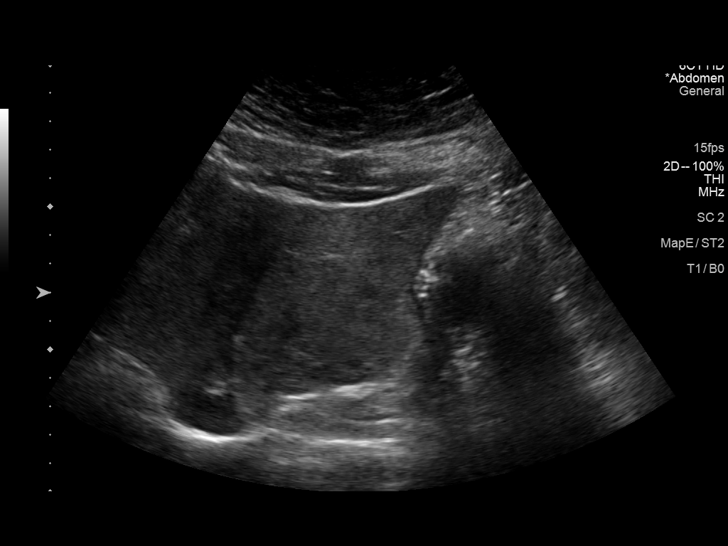
[im 48/77]
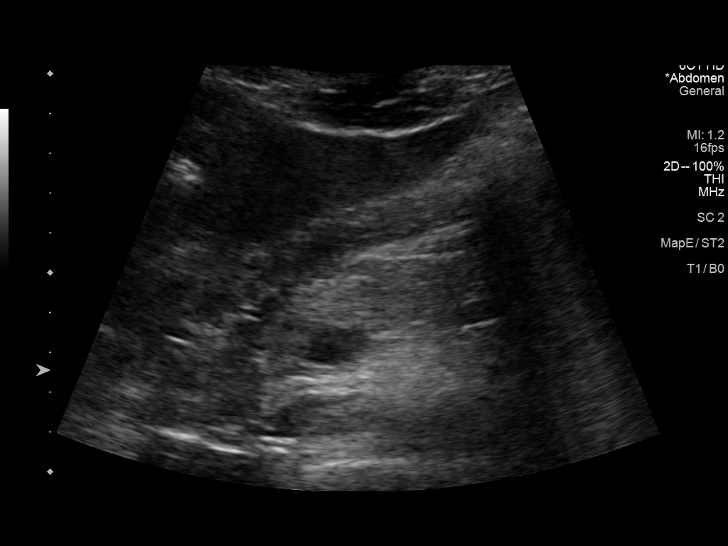
[im 51/77]
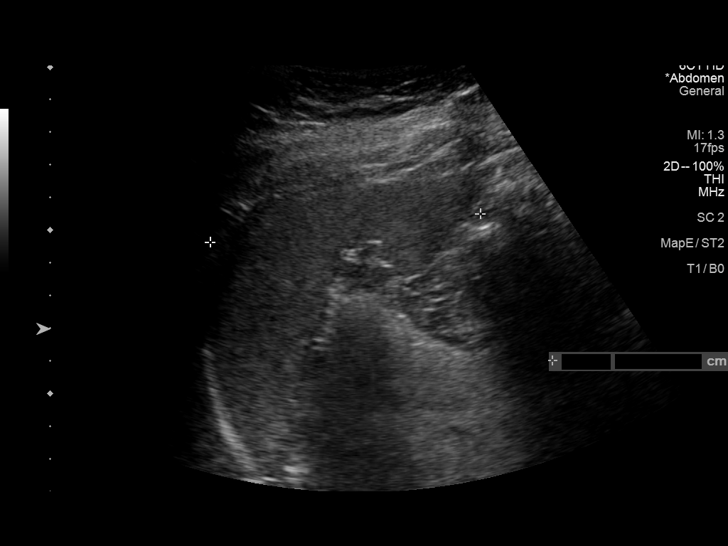
[im 58/77]
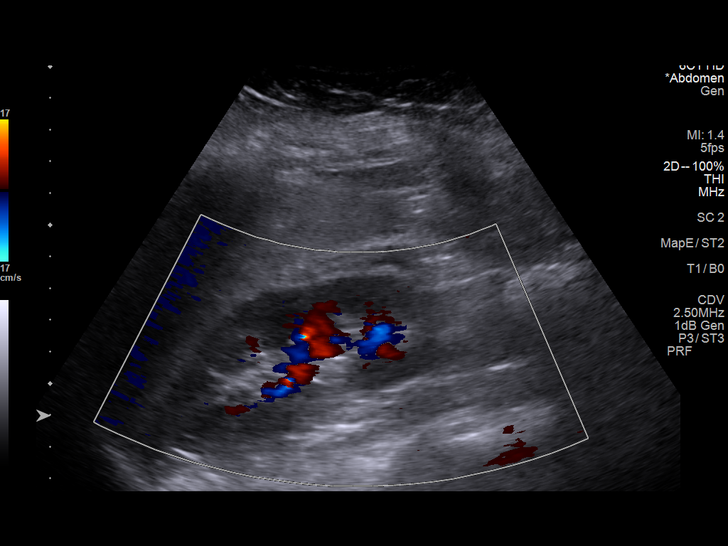
[im 64/77]
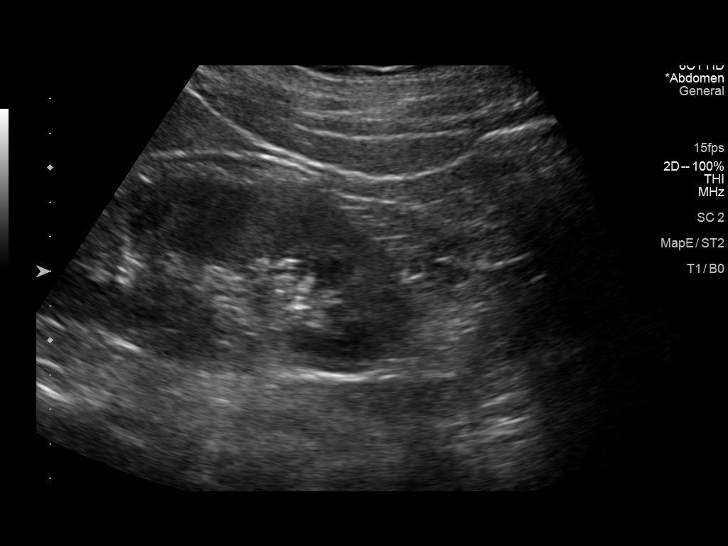
[im 70/77]
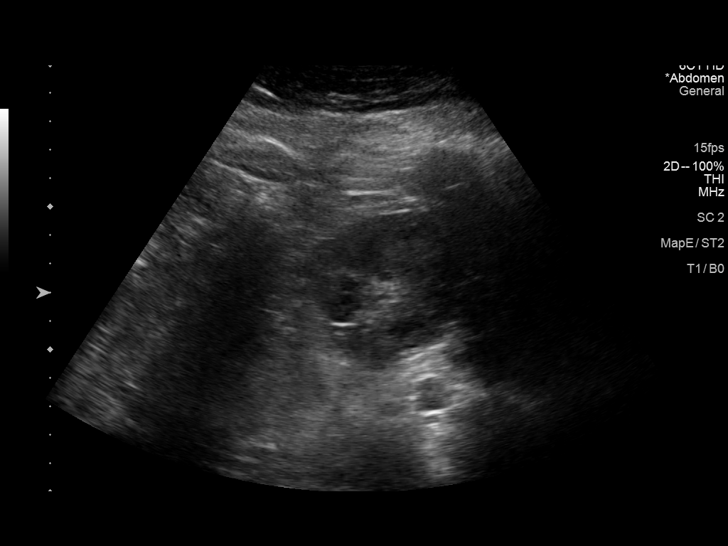
[im 77/77]
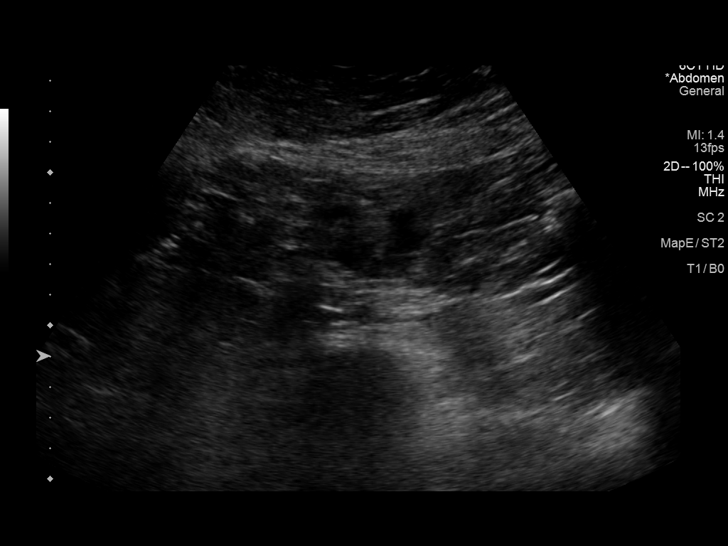

[14 of 25 positions shown; findings below may reference images not displayed]

FINDINGS: Gallbladder: No gallstones or wall thickening visualized. No
sonographic Murphy sign noted by sonographer.

Common bile duct: Diameter: 2.9 mm

Liver: Heterogenous echogenic liver. No focal hepatic abnormality.
Portal vein is patent on color Doppler imaging with normal direction
of blood flow towards the liver.

IVC: No abnormality visualized.

Pancreas: Visualized portion unremarkable.

Spleen: Size and appearance within normal limits.

Right Kidney: Length: 11.5 cm. Echogenicity within normal limits. No
mass or hydronephrosis visualized.

Left Kidney: Length: 11.4 cm. Echogenicity within normal limits. No
mass or hydronephrosis visualized.

Abdominal aorta: No aneurysm visualized.

Other findings: None.
IMPRESSION: 1. Negative for gallstones or biliary dilatation
2. Heterogenous echogenic liver consistent with steatosis.

## 2022-08-01 ENCOUNTER — Ambulatory Visit (INDEPENDENT_AMBULATORY_CARE_PROVIDER_SITE_OTHER): Payer: BC Managed Care – PPO | Admitting: Obstetrics & Gynecology

## 2022-08-01 ENCOUNTER — Encounter: Payer: Self-pay | Admitting: Obstetrics & Gynecology

## 2022-08-01 ENCOUNTER — Other Ambulatory Visit (HOSPITAL_COMMUNITY)
Admission: RE | Admit: 2022-08-01 | Discharge: 2022-08-01 | Disposition: A | Discharge status: Home / Self Care | Payer: BC Managed Care – PPO | Source: Ambulatory Visit | Attending: Obstetrics & Gynecology | Admitting: Obstetrics & Gynecology

## 2022-08-01 VITALS — BP 117/81 | HR 90 | Ht 62.5 in | Wt 201.2 lb

## 2022-08-01 DIAGNOSIS — Z3041 Encounter for surveillance of contraceptive pills: Secondary | ICD-10-CM

## 2022-08-01 DIAGNOSIS — Z01419 Encounter for gynecological examination (general) (routine) without abnormal findings: Secondary | ICD-10-CM

## 2022-08-01 MED ORDER — NORGESTIMATE-ETH ESTRADIOL 0.25-35 MG-MCG PO TABS: 1.0000 | ORAL_TABLET | Freq: Every day | ORAL | 4 refills | Status: DC

## 2022-08-01 NOTE — Progress Notes (Signed)
WELL-WOMAN EXAMINATION Patient name: Christina Macias MRN [id number]  Date of birth: Apr 29, 1996 Chief Complaint:   Gynecologic Exam  History of Present Illness:   Christina Macias is a 26 y.o. G0P0000 female being seen today for a routine well-woman exam.   Today she notes no acute complaints or concerns.  Switched to different pill in January due to some irregularity in her period.  Sometimes will not have a period, sometimes heavier for a day or so.  Improvement of dysmenorrhea.  Patient's last menstrual period was 07/23/2022 (approximate).  The current method of family planning is OCP (estrogen/progesterone).    Last pap 2020.  Last mammogram: n/a. Last colonoscopy: n/a     08/01/2022    8:39 AM 07/03/2019   11:17 AM 07/03/2019   11:11 AM  Depression screen PHQ 2/9  Decreased Interest 1 0 1  Down, Depressed, Hopeless 1 0 1  PHQ - 2 Score 2 0 2  Altered sleeping 2  1  Tired, decreased energy 2  1  Change in appetite 0  0  Feeling bad or failure about yourself  0  0  Trouble concentrating 3  1  Moving slowly or fidgety/restless 2  0  Suicidal thoughts 0  0  PHQ-9 Score 11  5  Difficult doing work/chores   Not difficult at all      Review of Systems:   Pertinent items are noted in HPI Denies any headaches, blurred vision, fatigue, shortness of breath, chest pain, abdominal pain, bowel movements, urination, or intercourse unless otherwise stated above.  Pertinent History Reviewed:  Reviewed past medical,surgical, social and family history.  Reviewed problem list, medications and allergies. Physical Assessment:   Vitals:   08/01/22 0832  BP: 117/81  Pulse: 90  Weight: 201 lb 3.2 oz (91.3 kg)  Height: 5' 2.5" (1.588 m)  Body mass index is 36.21 kg/m.        Physical Examination:   General appearance - well appearing, and in no distress  Mental status - alert, oriented to person, place, and time  Psych:  She has a normal mood and affect  Skin - warm and  dry, normal color, no suspicious lesions noted  Chest - effort normal, all lung fields clear to auscultation bilaterally  Heart - normal rate and regular rhythm  Neck:  midline trachea, no thyromegaly or nodules  Breasts - breasts appear normal, no suspicious masses, no skin or nipple changes or  axillary nodes  Abdomen - soft, nontender, nondistended, no masses or organomegaly  Pelvic - VULVA: normal appearing vulva with no masses, tenderness or lesions  VAGINA: normal appearing vagina with normal color and discharge, no lesions  CERVIX: normal appearing cervix without discharge or lesions, no CMT  Thin prep pap is done with HR HPV cotesting  UTERUS: uterus is felt to be normal size, shape, consistency and nontender   ADNEXA: No adnexal masses or tenderness noted.  Extremities:  No swelling or varicosities noted  Chaperone:  pt declined      Assessment & Plan:  1) Well-Woman Exam -pap collected, reviewed screening guidelines -STI screening declined  2) Contraceptive management -continue with current OCPs  Meds ordered this encounter  Medications   norgestimate-ethinyl estradiol (ORTHO-CYCLEN) 0.25-35 MG-MCG tablet    Sig: Take 1 tablet by mouth daily.    Dispense:  90 tablet    Refill:  4      Meds:  Meds ordered this encounter  Medications   norgestimate-ethinyl estradiol (ORTHO-CYCLEN)  0.25-35 MG-MCG tablet    Sig: Take 1 tablet by mouth daily.    Dispense:  90 tablet    Refill:  4    Follow-up: Return in about 1 year (around 08/02/2023) for Annual.   Myna Hidalgo, DO Attending Obstetrician & Gynecologist, [facility] for [facility], [facility]

## 2022-08-03 LAB — CYTOLOGY - PAP
Adequacy: ABSENT
Comment: NEGATIVE
Diagnosis: NEGATIVE
High risk HPV: NEGATIVE

## 2023-01-29 ENCOUNTER — Other Ambulatory Visit: Payer: Self-pay | Admitting: Internal Medicine

## 2023-01-29 DIAGNOSIS — R519 Headache, unspecified: Secondary | ICD-10-CM

## 2023-02-07 ENCOUNTER — Ambulatory Visit (HOSPITAL_COMMUNITY)
Admission: RE | Admit: 2023-02-07 | Discharge: 2023-02-07 | Disposition: A | Discharge status: Home / Self Care | Payer: BC Managed Care – PPO | Source: Ambulatory Visit | Attending: Internal Medicine | Admitting: Internal Medicine

## 2023-02-07 DIAGNOSIS — R519 Headache, unspecified: Secondary | ICD-10-CM | POA: Insufficient documentation

## 2023-10-15 ENCOUNTER — Other Ambulatory Visit: Payer: Self-pay | Admitting: *Deleted

## 2023-10-15 DIAGNOSIS — Z3041 Encounter for surveillance of contraceptive pills: Secondary | ICD-10-CM

## 2023-10-17 MED ORDER — NORGESTIMATE-ETH ESTRADIOL 0.25-35 MG-MCG PO TABS: 1.0000 | ORAL_TABLET | Freq: Every day | ORAL | 0 refills | Status: DC

## 2024-01-07 ENCOUNTER — Other Ambulatory Visit: Payer: Self-pay | Admitting: *Deleted

## 2024-01-07 DIAGNOSIS — Z3041 Encounter for surveillance of contraceptive pills: Secondary | ICD-10-CM

## 2024-01-11 ENCOUNTER — Encounter: Payer: Self-pay | Admitting: *Deleted

## 2024-03-11 ENCOUNTER — Ambulatory Visit: Payer: Self-pay | Admitting: Adult Health

## 2024-03-11 ENCOUNTER — Encounter: Payer: Self-pay | Admitting: Adult Health

## 2024-03-11 VITALS — BP 112/78 | HR 97 | Ht 62.0 in | Wt 218.0 lb

## 2024-03-11 DIAGNOSIS — F419 Anxiety disorder, unspecified: Secondary | ICD-10-CM | POA: Diagnosis not present

## 2024-03-11 DIAGNOSIS — N943 Premenstrual tension syndrome: Secondary | ICD-10-CM | POA: Diagnosis not present

## 2024-03-11 DIAGNOSIS — G43109 Migraine with aura, not intractable, without status migrainosus: Secondary | ICD-10-CM

## 2024-03-11 DIAGNOSIS — Z1331 Encounter for screening for depression: Secondary | ICD-10-CM

## 2024-03-11 MED ORDER — NORETHINDRONE 0.35 MG PO TABS: 1.0000 | ORAL_TABLET | Freq: Every day | ORAL | 11 refills | Status: AC

## 2024-03-11 NOTE — Progress Notes (Addendum)
 Subjective:     Patient ID: Christina Macias, female   DOB: 1996-09-15, 28 y.o.   MRN: [id number]  HPI Christina Macias is a 28 year old white female,single, G0P0, in complaining of anxiety before, after and during period. Stopped BCP in December. Has occasional hot flash and will get teary.     Component Value Date/Time   DIAGPAP  08/01/2022 0837    - Negative for intraepithelial lesion or malignancy (NILM)   DIAGPAP  07/03/2019 0000    NEGATIVE FOR INTRAEPITHELIAL LESIONS OR MALIGNANCY.   HPVHIGH Negative 08/01/2022 0837   ADEQPAP  08/01/2022 0837    Satisfactory for evaluation; transformation zone component ABSENT.   ADEQPAP  07/03/2019 0000    Satisfactory for evaluation  endocervical/transformation zone component ABSENT.    PCP is Terie Purser PA.  Review of Systems  +anxiety before, after and during period. Stopped BCP in December. Has occasional hot flash and will get teary.   Not having sex +migraine with aura Reviewed past medical,surgical, social and family history. Reviewed medications and allergies.  Objective:   Physical Exam BP 112/78 (BP Location: Right Arm, Patient Position: Sitting, Cuff Size: Large)   Pulse 97   Ht 5\' 2"  (1.575 m)   Wt 218 lb (98.9 kg)   LMP 03/04/2024 (Exact Date)   BMI 39.87 kg/m     Skin warm and dry.  Lungs: clear to ausculation bilaterally. Cardiovascular: regular rate and rhythm.  Fall risk is low    03/11/2024   10:48 AM 08/01/2022    8:39 AM 07/03/2019   11:17 AM  Depression screen PHQ 2/9  Decreased Interest 1 1 0  Down, Depressed, Hopeless 2 1 0  PHQ - 2 Score 3 2 0  Altered sleeping 3 2   Tired, decreased energy 3 2   Change in appetite 2 0   Feeling bad or failure about yourself  1 0   Trouble concentrating 2 3   Moving slowly or fidgety/restless 2 2   Suicidal thoughts 0 0   PHQ-9 Score 16 11        03/11/2024   10:49 AM 08/01/2022    8:40 AM  GAD 7 : Generalized Anxiety Score  Nervous, Anxious, on Edge 3 1   Control/stop worrying 2 1  Worry too much - different things 3 1  Trouble relaxing 2 2  Restless 3 2  Easily annoyed or irritable 3 0  Afraid - awful might happen 2 1  Total GAD 7 Score 18 8    Upstream - 03/11/24 1045       Pregnancy Intention Screening   Does the patient want to become pregnant in the next year? No    Does the patient's partner want to become pregnant in the next year? No    Would the patient like to discuss contraceptive options today? No      Contraception Wrap Up   Current Method Abstinence    End Method Abstinence               Assessment:     1. Anxiety (Primary) anxiety before, after and during period.teary at times, is on lexapro Will try micronor, cans start today Meds ordered this encounter  Medications   norethindrone (MICRONOR) 0.35 MG tablet    Sig: Take 1 tablet (0.35 mg total) by mouth daily.    Dispense:  28 tablet    Refill:  11    Supervising Provider:   Duane Lope H [[id number]]  Try to get outside as much as possible   2. PMS (premenstrual syndrome) Will try Micronor to see if helps with anxiety with period  3. Migraine with aura and without status migrainosus, not intractable     Plan:     Follow up with in 3 months or sooner if needed

## 2024-06-11 ENCOUNTER — Ambulatory Visit: Admitting: Adult Health

## 2024-06-12 ENCOUNTER — Ambulatory Visit: Admitting: Adult Health

## 2024-06-12 ENCOUNTER — Encounter: Payer: Self-pay | Admitting: Adult Health

## 2024-06-12 VITALS — BP 116/77 | HR 88 | Ht 62.0 in | Wt 211.0 lb

## 2024-06-12 DIAGNOSIS — Z1331 Encounter for screening for depression: Secondary | ICD-10-CM | POA: Diagnosis not present

## 2024-06-12 DIAGNOSIS — N943 Premenstrual tension syndrome: Secondary | ICD-10-CM | POA: Diagnosis not present

## 2024-06-12 DIAGNOSIS — N9089 Other specified noninflammatory disorders of vulva and perineum: Secondary | ICD-10-CM | POA: Diagnosis not present

## 2024-06-12 DIAGNOSIS — F419 Anxiety disorder, unspecified: Secondary | ICD-10-CM | POA: Diagnosis not present

## 2024-06-12 NOTE — Progress Notes (Signed)
  Subjective:     Patient ID: Christina Macias, female   DOB: 03-Oct-1996, 28 y.o.   MRN: [id number]  HPI Christina Macias is a 28 year old white female,single, G0P0, back in follow up on starting Micronor  in [place] for anxiety and PMS and feels better, still emotional at times. Has had some dryness in vulva area and irritation.     Component Value Date/Time   DIAGPAP  08/01/2022 0837    - Negative for intraepithelial lesion or malignancy (NILM)   DIAGPAP  07/03/2019 0000    NEGATIVE FOR INTRAEPITHELIAL LESIONS OR MALIGNANCY.   HPVHIGH Negative 08/01/2022 0837   ADEQPAP  08/01/2022 0837    Satisfactory for evaluation; transformation zone component ABSENT.   ADEQPAP  07/03/2019 0000    Satisfactory for evaluation  endocervical/transformation zone component ABSENT.    PCP is Christina Barn PA  Review of Systems Anxiety and PMS better, not teary now Has vulva irritation with dryness now Reviewed past medical,surgical, social and family history. Reviewed medications and allergies.     Objective:   Physical Exam BP 116/77 (BP Location: Right Arm, Patient Position: Sitting, Cuff Size: Normal)   Pulse 88   Ht 5' 2 (1.575 m)   Wt 211 lb (95.7 kg)   LMP 05/19/2024   BMI 38.59 kg/m     Skin warm and dry. Lungs: clear to ausculation bilaterally. Cardiovascular: regular rate and rhythm.     06/12/2024   11:21 AM 03/11/2024   10:48 AM 08/01/2022    8:39 AM  Depression screen PHQ 2/9  Decreased Interest 0 1 1  Down, Depressed, Hopeless 0 2 1  PHQ - 2 Score 0 3 2  Altered sleeping 1 3 2   Tired, decreased energy 1 3 2   Change in appetite 0 2 0  Feeling bad or failure about yourself  0 1 0  Trouble concentrating 2 2 3   Moving slowly or fidgety/restless 0 2 2  Suicidal thoughts 0 0 0  PHQ-9 Score 4 16 11        06/12/2024   11:24 AM 03/11/2024   10:49 AM 08/01/2022    8:40 AM  GAD 7 : Generalized Anxiety Score  Nervous, Anxious, on Edge 1 3 1   Control/stop worrying 0 2 1  Worry too much - different  things 1 3 1   Trouble relaxing 1 2 2   Restless 0 3 2  Easily annoyed or irritable 1 3 0  Afraid - awful might happen 0 2 1  Total GAD 7 Score 4 18 8     Upstream - 06/12/24 1126       Pregnancy Intention Screening   Does the patient want to become pregnant in the next year? No    Does the patient's partner want to become pregnant in the next year? No    Would the patient like to discuss contraceptive options today? No      Contraception Wrap Up   Current Method Oral Contraceptive    End Method Oral Contraceptive    Contraception Counseling Provided No            Assessment:     1. Anxiety (Primary) Better, continue on micronor  and is on Lexarpo, has refills   2. PMS (premenstrual syndrome) Better with mirconor not as teary   3. Vulvar irritation Try aquaphor     Plan:     Return in 3 months for ROS and physical

## 2024-09-18 ENCOUNTER — Telehealth: Admitting: Adult Health

## 2024-09-18 ENCOUNTER — Encounter: Payer: Self-pay | Admitting: Adult Health

## 2024-09-18 VITALS — Ht 62.0 in | Wt 205.0 lb

## 2024-09-18 DIAGNOSIS — N943 Premenstrual tension syndrome: Secondary | ICD-10-CM | POA: Diagnosis not present

## 2024-09-18 DIAGNOSIS — Z3041 Encounter for surveillance of contraceptive pills: Secondary | ICD-10-CM

## 2024-09-18 DIAGNOSIS — F419 Anxiety disorder, unspecified: Secondary | ICD-10-CM

## 2024-09-18 NOTE — Progress Notes (Signed)
 Patient ID: Christina Macias, female   DOB: May 08, 1996, 28 y.o.   MRN: [id number]   TELEHEALTH GYNECOLOGY VISIT ENCOUNTER NOTE  [facility] location[facility]   Patient location: Work  I connected with Donald KANDICE Rosa on 09/18/24 at  8:30 AM EDT by telephone and verified that I am speaking with the correct person using two identifiers. Patient was unable to do MyChart audiovisual encounter due to technical difficulties, she tried several times.    I discussed the limitations, risks, security and privacy concerns of performing an evaluation and management service by telephone and the availability of in person appointments. I also discussed with the patient that there may be a patient responsible charge related to this service. The patient expressed understanding and agreed to proceed.   History:  Christina Macias is a 28 y.o. G0P0000 female being evaluated today for anxiety/PMS after being on Micronor  for awhile and doing good, periods are good too. She denies any headaches or other concerns.       Past Medical History:  Diagnosis Date   ADHD    Anxiety    Depression    TMJ disease    Past Surgical History:  Procedure Laterality Date   WISDOM TOOTH EXTRACTION     The following portions of the patient's history were reviewed and updated as appropriate: allergies, current medications, past family history, past medical history, past social history, past surgical history and problem list.   Health Maintenance:  Normal pap and negative HRHPV on 08/01/22  Review of Systems:  Pertinent items noted in HPI and remainder of comprehensive ROS otherwise negative.  Physical Exam:   General:  Alert, oriented and cooperative.   Mental Status: Normal mood and affect perceived. Normal judgment and thought content.  Physical exam deferred due to nature of the encounter Ht 5' 2 (1.575 m)   Wt 205 lb (93 kg)   LMP 09/15/2024 (Approximate)   BMI 37.49 kg/m       09/18/2024    8:43 AM 06/12/2024   11:21 AM 03/11/2024   10:48 AM  Depression screen PHQ 2/9  Decreased Interest 1 0 1  Down, Depressed, Hopeless 2 0 2  PHQ - 2 Score 3 0 3  Altered sleeping 2 1 3   Tired, decreased energy 3 1 3   Change in appetite 0 0 2  Feeling bad or failure about yourself  0 0 1  Trouble concentrating 0 2 2  Moving slowly or fidgety/restless 1 0 2  Suicidal thoughts 0 0 0  PHQ-9 Score 9 4 16        09/18/2024    8:46 AM 06/12/2024   11:24 AM 03/11/2024   10:49 AM 08/01/2022    8:40 AM  GAD 7 : Generalized Anxiety Score  Nervous, Anxious, on Edge 3 1 3 1   Control/stop worrying 3 0 2 1  Worry too much - different things 3 1 3 1   Trouble relaxing 1 1 2 2   Restless 0 0 3 2  Easily annoyed or irritable 1 1 3  0  Afraid - awful might happen 0 0 2 1  Total GAD 7 Score 11 4 18 8     Upstream - 09/18/24 0843       Pregnancy Intention Screening   Does the patient want to become pregnant in the next year? No    Does the patient's partner want to become pregnant in the next year? No    Would the patient like  to discuss contraceptive options today? No      Contraception Wrap Up   Current Method Abstinence;Oral Contraceptive    End Method Abstinence;Oral Contraceptive    Contraception Counseling Provided Yes            Labs and Imaging No results found for this or any previous visit (from the past 2 weeks). No results found.    Assessment and Plan:     1. PMS (premenstrual syndrome) (Primary) Continue micronor  says PMS is better  2. Anxiety On lexapro   3. 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 The patient was provided an opportunity to ask questions and all were answered. The patient agreed with the plan and demonstrated an understanding of the instructions.   The  patient was advised to call back or seek an in-person evaluation/go to the ED if the symptoms worsen or if the condition fails to improve as anticipated.  I provided 10  minutes of non-face-to-face time during this encounter.   Delon Lewis, NP [facility] [employer], [facility]

## 2024-11-27 MED ORDER — NORGESTIMATE-ETH ESTRADIOL 0.25-35 MG-MCG PO TABS: 1.0000 | ORAL_TABLET | Freq: Every day | ORAL | 0 refills | Status: AC
